# Patient Record
Sex: Male | Born: 1995 | Race: Black or African American | Hispanic: No | Marital: Single | Smoking: Never smoker
Health system: Southern US, Community
[De-identification: ages and names within clinical notes are randomized; demographics above are authoritative.]

---

## 2021-01-16 ENCOUNTER — Other Ambulatory Visit: Payer: Self-pay

## 2021-01-16 ENCOUNTER — Emergency Department (HOSPITAL_COMMUNITY): Payer: Self-pay

## 2021-01-16 ENCOUNTER — Encounter (HOSPITAL_COMMUNITY): Payer: Self-pay

## 2021-01-16 ENCOUNTER — Emergency Department (HOSPITAL_COMMUNITY)
Admission: EM | Admit: 2021-01-16 | Discharge: 2021-01-16 | Disposition: A | Discharge status: Home / Self Care | Payer: Self-pay | Attending: Emergency Medicine | Admitting: Emergency Medicine

## 2021-01-16 DIAGNOSIS — S20312A Abrasion of left front wall of thorax, initial encounter: Secondary | ICD-10-CM | POA: Insufficient documentation

## 2021-01-16 DIAGNOSIS — T148XXA Other injury of unspecified body region, initial encounter: Secondary | ICD-10-CM

## 2021-01-16 DIAGNOSIS — S52611A Displaced fracture of right ulna styloid process, initial encounter for closed fracture: Secondary | ICD-10-CM | POA: Insufficient documentation

## 2021-01-16 DIAGNOSIS — M25532 Pain in left wrist: Secondary | ICD-10-CM | POA: Insufficient documentation

## 2021-01-16 DIAGNOSIS — M25559 Pain in unspecified hip: Secondary | ICD-10-CM

## 2021-01-16 DIAGNOSIS — S7002XA Contusion of left hip, initial encounter: Secondary | ICD-10-CM | POA: Insufficient documentation

## 2021-01-16 DIAGNOSIS — Y92410 Unspecified street and highway as the place of occurrence of the external cause: Secondary | ICD-10-CM | POA: Insufficient documentation

## 2021-01-16 MED ORDER — BACITRACIN ZINC 500 UNIT/GM EX OINT
TOPICAL_OINTMENT | Freq: Once | CUTANEOUS | Status: AC
  Administered 2021-01-16: 1 via TOPICAL
  Filled 2021-01-16: qty 1.8

## 2021-01-16 MED ORDER — FENTANYL CITRATE (PF) 100 MCG/2ML IJ SOLN
75.0000 ug | Freq: Once | INTRAMUSCULAR | Status: AC
Start: 2021-01-16 — End: 2021-01-16
  Administered 2021-01-16: 75 ug via INTRAMUSCULAR
  Filled 2021-01-16 (×2): qty 2

## 2021-01-16 MED ORDER — ACETAMINOPHEN 325 MG PO TABS
650.0000 mg | ORAL_TABLET | Freq: Once | ORAL | Status: AC
  Administered 2021-01-16: 650 mg via ORAL
  Filled 2021-01-16: qty 2

## 2021-01-16 MED ORDER — HYDROCODONE-ACETAMINOPHEN 5-325 MG PO TABS: 1.0000 | ORAL_TABLET | Freq: Four times a day (QID) | ORAL | 0 refills | Status: AC | PRN

## 2021-01-16 MED ORDER — METHOCARBAMOL 500 MG PO TABS: 500.0000 mg | ORAL_TABLET | Freq: Two times a day (BID) | ORAL | 0 refills | Status: AC
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
## 2021-01-16 NOTE — ED Notes (Signed)
Patient verbalizes understanding of discharge instructions. Opportunity for questioning and answers were provided. Pt discharged from ED. 

## 2021-01-16 NOTE — ED Notes (Signed)
Pt transported to CT ?

## 2021-01-16 NOTE — ED Notes (Signed)
Pt transported to xray 
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
## 2021-01-16 NOTE — ED Notes (Addendum)
Pt stood up took one step and the left leg gave out help pt back to bed. Pt unable to ambulate
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
